# Patient Record
Sex: Male | Born: 1985 | Race: Black or African American | Hispanic: No | Marital: Single | Smoking: Current every day smoker
Health system: Southern US, Community
[De-identification: ages and names within clinical notes are randomized; demographics above are authoritative.]

## PROBLEM LIST (undated history)

## (undated) ENCOUNTER — Emergency Department (HOSPITAL_BASED_OUTPATIENT_CLINIC_OR_DEPARTMENT_OTHER): Payer: Commercial Managed Care - PPO

## (undated) DIAGNOSIS — T148XXA Other injury of unspecified body region, initial encounter: Secondary | ICD-10-CM

## (undated) HISTORY — PX: ABDOMINAL SURGERY: SHX537

---
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
## 2018-09-17 ENCOUNTER — Encounter (HOSPITAL_COMMUNITY): Payer: Self-pay

## 2018-09-17 ENCOUNTER — Ambulatory Visit (HOSPITAL_COMMUNITY)
Admission: EM | Admit: 2018-09-17 | Discharge: 2018-09-17 | Disposition: A | Discharge status: Home / Self Care | Payer: Self-pay | Attending: Family Medicine | Admitting: Family Medicine

## 2018-09-17 ENCOUNTER — Other Ambulatory Visit: Payer: Self-pay

## 2018-09-17 DIAGNOSIS — T7840XA Allergy, unspecified, initial encounter: Secondary | ICD-10-CM

## 2018-09-17 MED ORDER — METHYLPREDNISOLONE SODIUM SUCC 125 MG IJ SOLR
80.0000 mg | Freq: Once | INTRAMUSCULAR | Status: AC
  Administered 2018-09-17: 80 mg via INTRAMUSCULAR

## 2018-09-17 MED ORDER — METHYLPREDNISOLONE SODIUM SUCC 125 MG IJ SOLR
INTRAMUSCULAR | Status: AC
  Filled 2018-09-17: qty 2
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
## 2018-09-17 NOTE — Discharge Instructions (Addendum)
Believe that you are having some sort of allergic reaction to insect bite, most likely bedbug Giving you steroid injection here in clinic and recommending that if the symptoms worsen or do not improve you need to follow-up for recheck.
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
## 2019-06-12 ENCOUNTER — Other Ambulatory Visit: Payer: Self-pay

## 2019-06-12 ENCOUNTER — Ambulatory Visit (INDEPENDENT_AMBULATORY_CARE_PROVIDER_SITE_OTHER): Payer: Self-pay

## 2019-06-12 ENCOUNTER — Ambulatory Visit (HOSPITAL_COMMUNITY)
Admission: EM | Admit: 2019-06-12 | Discharge: 2019-06-12 | Disposition: A | Discharge status: Home / Self Care | Payer: Self-pay | Attending: Family Medicine | Admitting: Family Medicine

## 2019-06-12 ENCOUNTER — Encounter (HOSPITAL_COMMUNITY): Payer: Self-pay

## 2019-06-12 DIAGNOSIS — S20212A Contusion of left front wall of thorax, initial encounter: Secondary | ICD-10-CM

## 2019-06-12 DIAGNOSIS — R079 Chest pain, unspecified: Secondary | ICD-10-CM

## 2019-06-12 MED ORDER — IBUPROFEN 800 MG PO TABS: 800.0000 mg | ORAL_TABLET | Freq: Three times a day (TID) | ORAL | 0 refills | Status: DC
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
## 2019-06-12 NOTE — Discharge Instructions (Addendum)
Take the ibuprofen 3 x a day with food Return as needed
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
## 2020-03-22 ENCOUNTER — Ambulatory Visit (HOSPITAL_COMMUNITY)
Admission: EM | Admit: 2020-03-22 | Discharge: 2020-03-22 | Disposition: A | Discharge status: Home / Self Care | Payer: Self-pay | Attending: Family Medicine | Admitting: Family Medicine

## 2020-03-22 ENCOUNTER — Encounter (HOSPITAL_COMMUNITY): Payer: Self-pay

## 2020-03-22 ENCOUNTER — Other Ambulatory Visit: Payer: Self-pay

## 2020-03-22 DIAGNOSIS — T8149XA Infection following a procedure, other surgical site, initial encounter: Secondary | ICD-10-CM

## 2020-03-22 MED ORDER — DOXYCYCLINE HYCLATE 100 MG PO CAPS: 100.0000 mg | ORAL_CAPSULE | Freq: Two times a day (BID) | ORAL | 0 refills | Status: DC

## 2020-03-22 NOTE — Discharge Instructions (Addendum)
Call your surgeon tomorrow to arrange follow up.  Begin: Meds ordered this encounter  Medications   doxycycline (VIBRAMYCIN) 100 MG capsule    Sig: Take 1 capsule (100 mg total) by mouth 2 (two) times daily.    Dispense:  20 capsule    Refill:  0

## 2020-03-22 NOTE — ED Triage Notes (Signed)
Pt c/o new area of midline abdominal pain incisional/wound opening slightly and draining purulence fluid with odor.  Pt denies fever, n/v/d, increased redness.  Upper abdominal area red/warm, 4 circular areas approx 1cm in diameter draining yellow purulence, DSG with moderate soaking of yellow discharge.
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
## 2021-06-28 ENCOUNTER — Encounter (HOSPITAL_COMMUNITY): Payer: Self-pay | Admitting: Emergency Medicine

## 2021-06-28 ENCOUNTER — Emergency Department (HOSPITAL_COMMUNITY)
Admission: EM | Admit: 2021-06-28 | Discharge: 2021-06-28 | Disposition: A | Discharge status: Home / Self Care | Payer: Commercial Managed Care - PPO | Attending: Emergency Medicine | Admitting: Emergency Medicine

## 2021-06-28 ENCOUNTER — Other Ambulatory Visit: Payer: Self-pay

## 2021-06-28 DIAGNOSIS — K029 Dental caries, unspecified: Secondary | ICD-10-CM | POA: Insufficient documentation

## 2021-06-28 DIAGNOSIS — R200 Anesthesia of skin: Secondary | ICD-10-CM | POA: Insufficient documentation

## 2021-06-28 DIAGNOSIS — K0889 Other specified disorders of teeth and supporting structures: Secondary | ICD-10-CM | POA: Diagnosis present

## 2021-06-28 MED ORDER — PENICILLIN V POTASSIUM 500 MG PO TABS: 500.0000 mg | ORAL_TABLET | Freq: Four times a day (QID) | ORAL | 0 refills | Status: AC

## 2021-06-28 NOTE — ED Triage Notes (Signed)
Pt c/o left sided dental pain that started this morning.  ?

## 2021-06-28 NOTE — ED Provider Notes (Signed)
?[facility] EMERGENCY DEPARTMENT ?Provider Note ? ? ?CSN: [id number] ?Arrival date & time: 06/28/21  2058 ? ?  ? ?History ? ?Chief Complaint  ?Patient presents with  ? Dental Pain  ? ? ?Alex Dickerson is a 36 y.o. male presenting with dental pain.  Reports that 2 days ago he had a left upper tooth break off and this morning he woke up with pain and numbness to the left side of his face.  Says he has not seen a dentist in many years however just got health insurance and plans to get in touch with 1.   ? ?The history is provided by the patient.  ?Dental Pain ?Location:  Generalized ?Onset quality:  Gradual ?Duration:  3 days ?Timing:  Constant ?Progression:  Worsening ?Context: dental caries and dental fracture   ?Associated symptoms: facial pain   ?Associated symptoms: no difficulty swallowing, no drooling, no facial swelling, no gum swelling, no headaches, no oral bleeding and no oral lesions   ? ?  ? ?Home Medications ?Prior to Admission medications   ?Medication Sig Start Date End Date Taking? Authorizing Provider  ?penicillin v potassium (VEETID) 500 MG tablet Take 1 tablet (500 mg total) by mouth 4 (four) times daily for 10 days. 06/28/21 07/08/21 Yes Aloysious Vangieson A, PA-C  ?ascorbic acid (VITAMIN C) 500 MG tablet Take by mouth.    [provider]  ?doxycycline (VIBRAMYCIN) 100 MG capsule Take 1 capsule (100 mg total) by mouth 2 (two) times daily. 03/22/20   Mardella Layman, MD  ?ibuprofen (ADVIL) 800 MG tablet Take 1 tablet (800 mg total) by mouth 3 (three) times daily. 06/12/19   Eustace Moore, MD  ?   ? ?Allergies    ?Patient has no known allergies.   ? ?Review of Systems   ?Review of Systems  ?HENT:  Negative for drooling, facial swelling and mouth sores.   ?Neurological:  Negative for headaches.  ? ?Physical Exam ?Updated Vital Signs ?BP 126/73   Pulse 85   Temp 98.2 ?F (36.8 ?C) (Oral)   Resp 16   SpO2 97%  ?Physical Exam ?Vitals and nursing note reviewed.   ?Constitutional:   ?   Appearance: Normal appearance.  ?HENT:  ?   Head: Normocephalic and atraumatic.  ?   Mouth/Throat:  ?   Mouth: Mucous membranes are moist.  ?   Pharynx: Oropharynx is clear.  ?   Comments: All teeth in severely poor dentition.  Nearly all molars are broken off and there are cavities in all other teeth.  No sign of abscess however there is a broken and likely infected left upper molar. ?Eyes:  ?   General: No scleral icterus. ?   Conjunctiva/sclera: Conjunctivae normal.  ?Pulmonary:  ?   Effort: Pulmonary effort is normal. No respiratory distress.  ?Skin: ?   Findings: No rash.  ?Neurological:  ?   Mental Status: He is alert.  ?Psychiatric:     ?   Mood and Affect: Mood normal.  ? ? ?ED Results / Procedures / Treatments   ?Labs ?(all labs ordered are listed, but only abnormal results are displayed) ?Labs Reviewed - No data to display ? ?EKG ?None ? ?Radiology ?No results found. ? ?Procedures ?Procedures  ? ? ?Medications Ordered in ED ?Medications - No data to display ? ?ED Course/ Medical Decision Making/ A&P ?  ?                        ?  Medical Decision Making ?Risk ?Prescription drug management. ? ? ?36 year old male presenting with dental pain.  Says that he has had very poor dentition for many years however he broke a tooth a couple days ago and now is having increasing pain and numbness to the left side of his face ? ?Physical exam revealing of all teeth with dental caries.  No obvious abscess.  Airway clear, tolerating secretions. ? ?MDM/disposition: Unfortunately we are unable to perform dental work in the department however patient was given resources for low-cost dentists in the area.  He was also discharged with penicillin for any infection.  He sees [facility] for primary care and he will follow-up with them while he is unable to get in with a dentist for any further antibiotic needs.  He is agreeable to this plan and thankful for his care.  We will use ice, NSAIDs and Tylenol  for pain. ? ? ?Final Clinical Impression(s) / ED Diagnoses ?Final diagnoses:  ?Pain due to dental caries  ? ? ?Rx / DC Orders ?ED Discharge Orders   ? ?      Ordered  ?  penicillin v potassium (VEETID) 500 MG tablet  4 times daily       ? 06/28/21 2115  ? ?  ?  ? ?  ? ?Results and diagnoses were explained to the patient. Return precautions discussed in full. Patient had no additional questions and expressed complete understanding. ? ? ?This chart was dictated using voice recognition software.  Despite best efforts to proofread,  errors can occur which can change the documentation meaning.  ?  ?Saddie Benders, PA-C ?06/28/21 2145 ? ?  ?Rolan Bucco, MD ?06/29/21 0009 ? ?

## 2021-10-03 ENCOUNTER — Ambulatory Visit (HOSPITAL_COMMUNITY)
Admission: EM | Admit: 2021-10-03 | Discharge: 2021-10-03 | Disposition: A | Discharge status: Home / Self Care | Payer: Commercial Managed Care - PPO | Attending: Internal Medicine | Admitting: Internal Medicine

## 2021-10-03 ENCOUNTER — Encounter (HOSPITAL_COMMUNITY): Payer: Self-pay | Admitting: Emergency Medicine

## 2021-10-03 ENCOUNTER — Other Ambulatory Visit: Payer: Self-pay

## 2021-10-03 DIAGNOSIS — W19XXXA Unspecified fall, initial encounter: Secondary | ICD-10-CM

## 2021-10-03 DIAGNOSIS — S0591XA Unspecified injury of right eye and orbit, initial encounter: Secondary | ICD-10-CM | POA: Diagnosis not present

## 2021-10-03 MED ORDER — MAXITROL 3.5-10000-0.1 OP OINT: 1.0000 | TOPICAL_OINTMENT | Freq: Four times a day (QID) | OPHTHALMIC | 0 refills | Status: DC

## 2021-10-03 MED ORDER — CYCLOPENTOLATE HCL 1 % OP SOLN: 1.0000 [drp] | Freq: Every day | OPHTHALMIC | 0 refills | Status: DC
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
## 2021-10-03 NOTE — Discharge Instructions (Signed)
Apply 1 to 2 drops of the prescribed eyedrops into your right eye every day starting tonight.  Apply a small ribbon of the Maxitrol eye ointment into your eye 4 times daily (every 4 hours during the day) starting tonight.  Go to Dr. Eliane Decree office tomorrow morning and be there at 11 AM.  Dr. Allena Katz is going to try to work you in for an appointment and he said to let you know to expect to have to wait a short amount of time prior to being seen but that he will see you tomorrow as a work in.   If your symptoms worsen overnight or if you suddenly lose vision, please go to the emergency room.  Otherwise go to your appointment with Dr. Allena Katz tomorrow at 11 AM.  The address is on your paperwork.  Hang in there! I hope you feel better!

## 2021-11-07 ENCOUNTER — Encounter (HOSPITAL_BASED_OUTPATIENT_CLINIC_OR_DEPARTMENT_OTHER): Payer: Self-pay | Admitting: Emergency Medicine

## 2021-11-07 ENCOUNTER — Emergency Department (HOSPITAL_BASED_OUTPATIENT_CLINIC_OR_DEPARTMENT_OTHER)
Admission: EM | Admit: 2021-11-07 | Discharge: 2021-11-07 | Disposition: A | Discharge status: Home / Self Care | Payer: Commercial Managed Care - PPO | Attending: Emergency Medicine | Admitting: Emergency Medicine

## 2021-11-07 ENCOUNTER — Other Ambulatory Visit: Payer: Self-pay

## 2021-11-07 ENCOUNTER — Emergency Department (HOSPITAL_BASED_OUTPATIENT_CLINIC_OR_DEPARTMENT_OTHER)
Admission: EM | Admit: 2021-11-07 | Discharge: 2021-11-07 | Disposition: A | Discharge status: Home / Self Care | Payer: Commercial Managed Care - PPO | Source: Home / Self Care | Attending: Emergency Medicine | Admitting: Emergency Medicine

## 2021-11-07 DIAGNOSIS — M62838 Other muscle spasm: Secondary | ICD-10-CM | POA: Insufficient documentation

## 2021-11-07 DIAGNOSIS — Z5321 Procedure and treatment not carried out due to patient leaving prior to being seen by health care provider: Secondary | ICD-10-CM | POA: Diagnosis not present

## 2021-11-07 DIAGNOSIS — M79601 Pain in right arm: Secondary | ICD-10-CM | POA: Insufficient documentation

## 2021-11-07 MED ORDER — METHOCARBAMOL 500 MG PO TABS: 500.0000 mg | ORAL_TABLET | Freq: Two times a day (BID) | ORAL | 0 refills | Status: DC

## 2021-11-07 MED ORDER — NAPROXEN 250 MG PO TABS
500.0000 mg | ORAL_TABLET | Freq: Once | ORAL | Status: AC
  Administered 2021-11-07: 500 mg via ORAL
  Filled 2021-11-07: qty 2

## 2021-11-07 MED ORDER — NAPROXEN 500 MG PO TABS: 500.0000 mg | ORAL_TABLET | Freq: Two times a day (BID) | ORAL | 0 refills | Status: DC

## 2021-11-07 NOTE — ED Notes (Signed)
PT AT REGISTRATION, LEAVING.

## 2021-11-07 NOTE — ED Triage Notes (Signed)
Right arm pain x 2 days , no injury , also he reports woke up with right upper back pain , no injury as well.

## 2021-11-07 NOTE — ED Provider Notes (Signed)
[facility] EMERGENCY DEPARTMENT  Provider Note  CSN: [id number] Arrival date & time: 11/07/21 2009  History Chief Complaint  Patient presents with   Arm Pain    Alex Dickerson is a 36 y.o. male with no significant PMH reports 3 days of aching pain in R arm and shoulder/upper back areas, started after sleeping Saturday night. Worse with some movements including lifting boxes at work but not injured while at work. No numbness or difficulty with grip strength.    Home Medications Prior to Admission medications   Medication Sig Start Date End Date Taking? Authorizing Provider  methocarbamol (ROBAXIN) 500 MG tablet Take 1 tablet (500 mg total) by mouth 2 (two) times daily. 11/07/21  Yes Pollyann Savoy, MD  naproxen (NAPROSYN) 500 MG tablet Take 1 tablet (500 mg total) by mouth 2 (two) times daily. 11/07/21  Yes Pollyann Savoy, MD     Allergies    Patient has no known allergies.   Review of Systems   Review of Systems Please see HPI for pertinent positives and negatives  Physical Exam BP 121/81   Pulse 77   Temp 98.9 F (37.2 C) (Oral)   Resp 18   Ht 6' (1.829 m)   Wt 81.6 kg   SpO2 98%   BMI 24.41 kg/m   Physical Exam Vitals and nursing note reviewed.  HENT:     Head: Normocephalic.     Nose: Nose normal.  Eyes:     Extraocular Movements: Extraocular movements intact.  Cardiovascular:     Pulses: Normal pulses.  Pulmonary:     Effort: Pulmonary effort is normal.  Musculoskeletal:        General: Tenderness (R upper arm soft tissues, and thoracic paraspinal/trapezius muscles) present. Normal range of motion.     Cervical back: Neck supple.  Skin:    Findings: No rash (on exposed skin).  Neurological:     Mental Status: He is alert and oriented to person, place, and time.     Cranial Nerves: No cranial nerve deficit.     Sensory: No sensory deficit.     Motor: No weakness.  Psychiatric:        Mood and Affect: Mood normal.      ED Results / Procedures / Treatments   EKG None  Procedures Procedures  Medications Ordered in the ED Medications  naproxen (NAPROSYN) tablet 500 mg (has no administration in time range)    Initial Impression and Plan  Patient's symptoms most consistent with muscle spasm in his R trapezius/thoracic muscle areas with some radicular symptoms. Has not tried taking any meds at home other than topical OTC rubs. No concerning exam findings. Plan Rx for naprosyn, robaxin and PCP follow up if not improving in 2-3 days.   ED Course       MDM Rules/Calculators/A&P Medical Decision Making Problems Addressed: Muscle spasm: acute illness or injury  Risk Prescription drug management.    Final Clinical Impression(s) / ED Diagnoses Final diagnoses:  Muscle spasm    Rx / DC Orders ED Discharge Orders          Ordered    naproxen (NAPROSYN) 500 MG tablet  2 times daily        11/07/21 2310    methocarbamol (ROBAXIN) 500 MG tablet  2 times daily        11/07/21 2310             Pollyann Savoy, MD 11/07/21 2311
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
## 2023-01-06 ENCOUNTER — Encounter (HOSPITAL_COMMUNITY): Payer: Self-pay | Admitting: *Deleted

## 2023-01-06 ENCOUNTER — Other Ambulatory Visit: Payer: Self-pay

## 2023-01-06 ENCOUNTER — Encounter (HOSPITAL_COMMUNITY): Payer: Self-pay | Admitting: Pharmacy Technician

## 2023-01-06 ENCOUNTER — Ambulatory Visit (HOSPITAL_COMMUNITY)
Admission: EM | Admit: 2023-01-06 | Discharge: 2023-01-06 | Disposition: A | Discharge status: Home / Self Care | Payer: Medicaid Other

## 2023-01-06 ENCOUNTER — Emergency Department (HOSPITAL_COMMUNITY)
Admission: EM | Admit: 2023-01-06 | Discharge: 2023-01-06 | Disposition: A | Discharge status: Home / Self Care | Payer: Medicaid Other | Attending: Emergency Medicine | Admitting: Emergency Medicine

## 2023-01-06 DIAGNOSIS — R109 Unspecified abdominal pain: Secondary | ICD-10-CM | POA: Diagnosis present

## 2023-01-06 DIAGNOSIS — K439 Ventral hernia without obstruction or gangrene: Secondary | ICD-10-CM | POA: Insufficient documentation

## 2023-01-06 HISTORY — DX: Other injury of unspecified body region, initial encounter: T14.8XXA

## 2023-01-06 LAB — CBC
HCT: 47.8 % (ref 39.0–52.0)
Hemoglobin: 16.7 g/dL (ref 13.0–17.0)
MCH: 32.7 pg (ref 26.0–34.0)
MCHC: 34.9 g/dL (ref 30.0–36.0)
MCV: 93.5 fL (ref 80.0–100.0)
Platelets: 167 10*3/uL (ref 150–400)
RBC: 5.11 MIL/uL (ref 4.22–5.81)
RDW: 12.9 % (ref 11.5–15.5)
WBC: 8.8 10*3/uL (ref 4.0–10.5)
nRBC: 0 % (ref 0.0–0.2)

## 2023-01-06 LAB — URINALYSIS, ROUTINE W REFLEX MICROSCOPIC
Bilirubin Urine: NEGATIVE
Glucose, UA: NEGATIVE mg/dL
Hgb urine dipstick: NEGATIVE
Ketones, ur: NEGATIVE mg/dL
Nitrite: NEGATIVE
Protein, ur: NEGATIVE mg/dL
Specific Gravity, Urine: 1.026 (ref 1.005–1.030)
pH: 5 (ref 5.0–8.0)

## 2023-01-06 LAB — COMPREHENSIVE METABOLIC PANEL
ALT: 16 U/L (ref 0–44)
AST: 20 U/L (ref 15–41)
Albumin: 4.3 g/dL (ref 3.5–5.0)
Alkaline Phosphatase: 60 U/L (ref 38–126)
Anion gap: 12 (ref 5–15)
BUN: 13 mg/dL (ref 6–20)
CO2: 22 mmol/L (ref 22–32)
Calcium: 9.4 mg/dL (ref 8.9–10.3)
Chloride: 103 mmol/L (ref 98–111)
Creatinine, Ser: 1 mg/dL (ref 0.61–1.24)
GFR, Estimated: >60 mL/min (ref >60)
Glucose, Bld: 114 mg/dL — ABNORMAL HIGH (ref 70–99)
Potassium: 3.7 mmol/L (ref 3.5–5.1)
Sodium: 137 mmol/L (ref 135–145)
Total Bilirubin: 1.1 mg/dL (ref 0.3–1.2)
Total Protein: 7.5 g/dL (ref 6.5–8.1)

## 2023-01-06 LAB — LIPASE, BLOOD: Lipase: 22 U/L (ref 11–51)

## 2023-01-06 NOTE — Discharge Instructions (Signed)
1.  There is no sign that your hernia is trapped or incarcerated.  Hernias from prior surgeries in the abdominal wall are not uncommon.  You will need to follow-up with your surgeon if you want to be assessed for hernia repair surgery to decrease the size of your hernia. 2.  You will need to avoid sudden movements and lifting that will increase abdominal pressure quickly.  This will put more pressure on it.  Maintaining a healthy weight and a strong core muscles will be helpful.
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
## 2023-01-06 NOTE — ED Triage Notes (Signed)
Pt here from UC with reports of abdominal pain since yesterday. Hx hernia. Denies NVD.

## 2023-01-06 NOTE — ED Notes (Signed)
Patient is being discharged from the Urgent Care and sent to the Emergency Department via private vehicle . Per Harle Stanford, NP, patient is in need of higher level of care due to hernia, abd pain. Patient is aware and verbalizes understanding of plan of care.  Vitals:   01/06/23 1744  BP: 126/82  Pulse: 97  Resp: 16  Temp: 98.4 F (36.9 C)  SpO2: 98%
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
## 2024-04-01 ENCOUNTER — Ambulatory Visit
Admission: EM | Admit: 2024-04-01 | Discharge: 2024-04-01 | Disposition: A | Discharge status: Home / Self Care | Attending: Family Medicine | Admitting: Family Medicine

## 2024-04-01 DIAGNOSIS — K047 Periapical abscess without sinus: Secondary | ICD-10-CM | POA: Diagnosis not present

## 2024-04-01 MED ORDER — NAPROXEN 500 MG PO TABS: 500.0000 mg | ORAL_TABLET | Freq: Two times a day (BID) | ORAL | 0 refills | Status: AC

## 2024-04-01 MED ORDER — AMOXICILLIN-POT CLAVULANATE 875-125 MG PO TABS: 1.0000 | ORAL_TABLET | Freq: Two times a day (BID) | ORAL | 0 refills | Status: AC
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
## 2024-04-01 NOTE — Discharge Instructions (Signed)
Make sure you schedule an appointment with a dentist/dental surgeon as soon as possible.  You may try some of the resources below.    [facility] in [place], [place] Address: [address], [place], [place] [address] Phone: [phone]  [place] [phone] extension [phone] [address].  Dr. Donn Pierini [phone] [place] [phone] [address] [facility] [place].  Rescue mission [phone] extension [phone] [address]. [address], [place], [place], [address] First come first serve for the first 10 clients.  May do simple extractions only, no wisdom teeth or surgery.  You may try the second for Thursday of the month starting at [place] [employer] You may call the school to see if they are still helping to provide dental care for emergent cases.

## 2024-04-01 NOTE — ED Triage Notes (Signed)
 Pt c/o left lower dental pain day 2-no pain meds today-NAD-steady gait
